# Patient Record
Sex: Female | Born: 1980 | Race: Black or African American | Hispanic: No | Marital: Married | Smoking: Never smoker
Health system: Southern US, Community
[De-identification: ages and names within clinical notes are randomized; demographics above are authoritative.]

## PROBLEM LIST (undated history)

## (undated) DIAGNOSIS — K219 Gastro-esophageal reflux disease without esophagitis: Secondary | ICD-10-CM

---

## 2012-04-06 ENCOUNTER — Emergency Department (HOSPITAL_BASED_OUTPATIENT_CLINIC_OR_DEPARTMENT_OTHER): Payer: Medicaid Other

## 2012-04-06 ENCOUNTER — Emergency Department (HOSPITAL_BASED_OUTPATIENT_CLINIC_OR_DEPARTMENT_OTHER)
Admission: EM | Admit: 2012-04-06 | Discharge: 2012-04-06 | Disposition: A | Discharge status: Home / Self Care | Payer: Medicaid Other | Attending: Emergency Medicine | Admitting: Emergency Medicine

## 2012-04-06 ENCOUNTER — Encounter (HOSPITAL_BASED_OUTPATIENT_CLINIC_OR_DEPARTMENT_OTHER): Payer: Self-pay | Admitting: Family Medicine

## 2012-04-06 DIAGNOSIS — K219 Gastro-esophageal reflux disease without esophagitis: Secondary | ICD-10-CM | POA: Insufficient documentation

## 2012-04-06 DIAGNOSIS — R079 Chest pain, unspecified: Secondary | ICD-10-CM | POA: Insufficient documentation

## 2012-04-06 LAB — CBC WITH DIFFERENTIAL/PLATELET
Basophils Absolute: 0 10*3/uL (ref 0.0–0.1)
Eosinophils Relative: 0 % (ref 0–5)
HCT: 39.4 % (ref 36.0–46.0)
Hemoglobin: 13.9 g/dL (ref 12.0–15.0)
Lymphocytes Relative: 15 % (ref 12–46)
MCHC: 35.3 g/dL (ref 30.0–36.0)
MCV: 84.9 fL (ref 78.0–100.0)
Monocytes Absolute: 0.6 10*3/uL (ref 0.1–1.0)
Monocytes Relative: 7 % (ref 3–12)
RDW: 12.4 % (ref 11.5–15.5)

## 2012-04-06 LAB — URINALYSIS, ROUTINE W REFLEX MICROSCOPIC
Bilirubin Urine: NEGATIVE
Hgb urine dipstick: NEGATIVE
Protein, ur: NEGATIVE mg/dL
Urobilinogen, UA: 1 mg/dL (ref 0.0–1.0)

## 2012-04-06 LAB — BASIC METABOLIC PANEL
BUN: 11 mg/dL (ref 6–23)
Calcium: 9.6 mg/dL (ref 8.4–10.5)
Creatinine, Ser: 0.9 mg/dL (ref 0.50–1.10)
GFR calc non Af Amer: 86 mL/min — ABNORMAL LOW (ref >90)
Glucose, Bld: 109 mg/dL — ABNORMAL HIGH (ref 70–99)

## 2012-04-06 MED ORDER — SODIUM CHLORIDE 0.9 % IV BOLUS (SEPSIS)
1000.0000 mL | Freq: Once | INTRAVENOUS | Status: AC
  Administered 2012-04-06: 1000 mL via INTRAVENOUS

## 2012-04-06 MED ORDER — GI COCKTAIL ~~LOC~~
30.0000 mL | Freq: Once | ORAL | Status: AC
  Administered 2012-04-06: 30 mL via ORAL
  Filled 2012-04-06: qty 30

## 2012-04-06 MED ORDER — FAMOTIDINE 20 MG PO TABS: 20.0000 mg | ORAL_TABLET | Freq: Two times a day (BID) | ORAL | Status: DC

## 2012-04-06 MED ORDER — POTASSIUM CHLORIDE CRYS ER 20 MEQ PO TBCR
40.0000 meq | EXTENDED_RELEASE_TABLET | Freq: Once | ORAL | Status: AC
  Administered 2012-04-06: 40 meq via ORAL
  Filled 2012-04-06: qty 2

## 2012-04-06 NOTE — ED Notes (Signed)
Pt brought via [facility] from work - pt [facility] 129. Pt c/o chest tightness at 2:15pm today that has improved since onset. Pt was hyperventilating upon EMS arrival. Pt smiling and talking in triage, denies shob, n/v. nad noted.

## 2012-04-07 NOTE — ED Provider Notes (Signed)
History     CSN: [id number]  Arrival date & time 04/06/12  1513   First MD Initiated Contact with Patient 04/06/12 1514      Chief Complaint  Patient presents with  . Chest Pain    (Consider location/radiation/quality/duration/timing/severity/associated sxs/prior treatment) HPI Patient is a 31 year old female who presents by EMS today from work for evaluation of chest pain. Patient was sitting at her desk following lunch at 2:15 PM when she developed sudden onset of substernal chest pain which she rates as an 8/10. Patient had some shortness of breath with this. She denied any anxiety preceding the event or heavy exertion. Patient reports that she became slightly lightheaded with this. Upon arrival patient reports 2/10 substernal chest discomfort with no shortness of breath, nausea, vomiting, or lightheadedness. She does not believe she could be pregnant. She does have heavy menstrual periods and is not menstruating currently. She's had no recent fevers or coughs. She denies sick contacts. The timing of patient's symptoms is suspicious for involvement of reflux patient denies this feeling similar to that. She has no history of asthma or other pulmonary diseases. There no other associated or modifying factors. Patient's pain has improved spontaneously that she feels that oxygen on the ambulance may have helped. There is nothing that makes her symptoms worse. History reviewed. No pertinent past medical history.  History reviewed. No pertinent past surgical history.  History reviewed. No pertinent family history.  History  Substance Use Topics  . Smoking status: Never Smoker   . Smokeless tobacco: Not on file  . Alcohol Use: No    OB History    Grav Para Term Preterm Abortions TAB SAB Ect Mult Living                  Review of Systems  Constitutional: Negative.   HENT: Negative.   Eyes: Negative.   Respiratory: Negative.   Cardiovascular: Positive for chest pain.    Gastrointestinal: Negative.   Genitourinary: Negative.   Musculoskeletal: Negative.   Skin: Negative.   Neurological: Negative.   Hematological: Negative.   Psychiatric/Behavioral: Negative.   All other systems reviewed and are negative.    Allergies  Review of patient's allergies indicates no known allergies.  Home Medications   Current Outpatient Rx  Name Route Sig Dispense Refill  . ACETAMINOPHEN 325 MG PO TABS Oral Take 650 mg by mouth every 6 (six) hours as needed. For pain.    Marland Kitchen PRESCRIPTION MEDICATION Oral Take 1 tablet by mouth daily. Generesse SE 0.8-25.    Marland Kitchen FAMOTIDINE 20 MG PO TABS Oral Take 1 tablet (20 mg total) by mouth 2 (two) times daily. 60 tablet 0    BP 115/54  Pulse 71  Temp 98.3 F (36.8 C) (Oral)  Resp 18  SpO2 100%  LMP 03/30/2012  Physical Exam  Nursing note and vitals reviewed. GEN: Well-developed, well-nourished female in no distress HEENT: Atraumatic, normocephalic. Oropharynx clear without erythema EYES: PERRLA BL, no scleral icterus. NECK: Trachea midline, no meningismus CV: regular rate and rhythm. No murmurs, rubs, or gallops PULM: No respiratory distress.  No crackles, wheezes, or rales. GI: soft, non-tender. No guarding, rebound, or tenderness. + bowel sounds  GU: deferred Neuro: cranial nerves grossly 2-12 intact, no abnormalities of strength or sensation, A and O x 3 MSK: Patient moves all 4 extremities symmetrically, no deformity, edema, or injury noted Skin: No rashes petechiae, purpura, or jaundice Psych: no abnormality of mood   ED Course  Procedures (including critical  care time)  Indication: chest pain Please note this EKG was reviewed extemporaneously by myself.   Date: 04/06/2012  Rate: 74  Rhythm: normal sinus rhythm  QRS Axis: normal  Intervals: normal  ST/T Wave abnormalities: normal  Conduction Disutrbances:none  Narrative Interpretation:   Old EKG Reviewed: none available      Labs Reviewed  BASIC  METABOLIC PANEL - Abnormal; Notable for the following:    Potassium 3.4 (*)     Glucose, Bld 109 (*)     GFR calc non Af Amer 86 (*)     All other components within normal limits  URINALYSIS, ROUTINE W REFLEX MICROSCOPIC - Abnormal; Notable for the following:    Ketones, ur 15 (*)     Leukocytes, UA SMALL (*)     All other components within normal limits  CBC WITH DIFFERENTIAL - Abnormal; Notable for the following:    Neutrophils Relative 78 (*)     All other components within normal limits  URINE MICROSCOPIC-ADD ON - Abnormal; Notable for the following:    Squamous Epithelial / LPF FEW (*)     All other components within normal limits  PREGNANCY, URINE   Dg Chest 2 View  04/06/2012  *RADIOLOGY REPORT*  Clinical Data: Chest pain, tightness  CHEST - 2 VIEW  Comparison: None.  Findings: Lungs are clear. No pleural effusion or pneumothorax.  Cardiomediastinal silhouette is within normal limits.  Visualized osseous structures are within normal limits.  IMPRESSION: Normal chest radiographs.   Original Report Authenticated By: Charline Bills, M.D.      1. GERD (gastroesophageal reflux disease)   2. Chest pain       MDM  Based on patient presentation I low suspicion for ACS. Patient had unremarkable EKG and chest x-ray was unremarkable as well. Patient is demonstrating female and hemoglobin was checked. CBC was completely within normal limits. Patient had negative urine pregnancy and was feeling well without any therapies here. Farther along in the visit the patient did feel that she had had an increase in recent episodes of heartburn. She's given a GI cocktail here. Patient does not have a primary care physician but does have insurance and was advised to followup with her PCP. She was given a prescription for Pepcid and was discharged in good condition. Patient was told she is welcome to return if she has a worsening or change her symptoms. She had no risk factors for thromboembolic disease  and was PERC negative.        Cyndra Numbers, MD 04/08/12 [phone]

## 2012-09-02 ENCOUNTER — Encounter (HOSPITAL_BASED_OUTPATIENT_CLINIC_OR_DEPARTMENT_OTHER): Payer: Self-pay

## 2012-09-02 ENCOUNTER — Emergency Department (HOSPITAL_BASED_OUTPATIENT_CLINIC_OR_DEPARTMENT_OTHER)
Admission: EM | Admit: 2012-09-02 | Discharge: 2012-09-02 | Disposition: A | Discharge status: Home / Self Care | Payer: 59 | Attending: Emergency Medicine | Admitting: Emergency Medicine

## 2012-09-02 DIAGNOSIS — Z9889 Other specified postprocedural states: Secondary | ICD-10-CM | POA: Insufficient documentation

## 2012-09-02 DIAGNOSIS — R112 Nausea with vomiting, unspecified: Secondary | ICD-10-CM | POA: Insufficient documentation

## 2012-09-02 DIAGNOSIS — R197 Diarrhea, unspecified: Secondary | ICD-10-CM | POA: Insufficient documentation

## 2012-09-02 DIAGNOSIS — R1110 Vomiting, unspecified: Secondary | ICD-10-CM

## 2012-09-02 LAB — BASIC METABOLIC PANEL
CO2: 24 mEq/L (ref 19–32)
Calcium: 8.9 mg/dL (ref 8.4–10.5)
Chloride: 105 mEq/L (ref 96–112)
Glucose, Bld: 95 mg/dL (ref 70–99)
Sodium: 139 mEq/L (ref 135–145)

## 2012-09-02 MED ORDER — ONDANSETRON 8 MG PO TBDP: 8.0000 mg | ORAL_TABLET | Freq: Three times a day (TID) | ORAL | Status: AC | PRN

## 2012-09-02 MED ORDER — ONDANSETRON HCL 4 MG/2ML IJ SOLN
4.0000 mg | Freq: Once | INTRAMUSCULAR | Status: AC
  Administered 2012-09-02: 4 mg via INTRAVENOUS
  Filled 2012-09-02: qty 2

## 2012-09-02 MED ORDER — SODIUM CHLORIDE 0.9 % IV SOLN
1000.0000 mL | Freq: Once | INTRAVENOUS | Status: AC
  Administered 2012-09-02: 1000 mL via INTRAVENOUS

## 2012-09-02 MED ORDER — SODIUM CHLORIDE 0.9 % IV SOLN
1000.0000 mL | INTRAVENOUS | Status: DC
  Administered 2012-09-02: 1000 mL via INTRAVENOUS

## 2012-09-02 NOTE — ED Provider Notes (Addendum)
History    CSN: [id number] Arrival date & time 09/02/12  0716 First MD Initiated Contact with Patient 09/02/12 [phone]      Chief Complaint  Patient presents with  . Emesis  . Nausea  . Diarrhea    Patient is a 32 y.o. female presenting with vomiting and diarrhea. The history is provided by the patient.  Emesis  This is a new problem. Episode onset: Last night. The problem occurs more than 10 times per day. The problem has not changed since onset.The emesis has an appearance of stomach contents. There has been no fever. Associated symptoms include diarrhea. Pertinent negatives include no abdominal pain and no fever. Risk factors include ill contacts.  Diarrhea The primary symptoms include vomiting and diarrhea. Primary symptoms do not include fever or abdominal pain.  The diarrhea occurs more than 10 times per day.   the patient's husband is here in the emergency room as well for a similar illness.  History reviewed. No pertinent past medical history.  Past Surgical History  Procedure Date  . Cesarean section     No family history on file.  History  Substance Use Topics  . Smoking status: Never Smoker   . Smokeless tobacco: Not on file  . Alcohol Use: No    OB History    Grav Para Term Preterm Abortions TAB SAB Ect Mult Living                  Review of Systems  Constitutional: Negative for fever.  Respiratory: Negative for choking.   Cardiovascular: Negative for chest pain.  Gastrointestinal: Positive for vomiting and diarrhea. Negative for abdominal pain and abdominal distention.  Genitourinary:       Last menstrual period January 10 which was normal    Allergies  Review of patient's allergies indicates no known allergies.  Home Medications   Current Outpatient Rx  Name  Route  Sig  Dispense  Refill  . ACETAMINOPHEN 325 MG PO TABS   Oral   Take 650 mg by mouth every 6 (six) hours as needed. For pain.         Marland Kitchen PRESCRIPTION MEDICATION   Oral   Take 1  tablet by mouth daily. Generesse SE 0.8-25.           BP 114/75  Pulse 104  Temp 97.7 F (36.5 C) (Oral)  Resp 16  Ht 5\' 2"  (1.575 m)  Wt 187 lb (84.823 kg)  BMI 34.20 kg/m2  SpO2 100%  LMP 08/19/2012  Physical Exam  Nursing note and vitals reviewed. Constitutional: She appears well-developed and well-nourished. No distress.  HENT:  Head: Normocephalic and atraumatic.  Right Ear: External ear normal.  Left Ear: External ear normal.  Eyes: Conjunctivae normal are normal. Right eye exhibits no discharge. Left eye exhibits no discharge. No scleral icterus.  Neck: Neck supple. No tracheal deviation present.  Cardiovascular: Normal rate, regular rhythm and intact distal pulses.   Pulmonary/Chest: Effort normal and breath sounds normal. No stridor. No respiratory distress. She has no wheezes. She has no rales.  Abdominal: Soft. Bowel sounds are normal. She exhibits no distension. There is no tenderness. There is no rebound and no guarding.  Musculoskeletal: She exhibits no edema and no tenderness.  Neurological: She is alert. She has normal strength. No sensory deficit. Cranial nerve deficit:  no gross defecits noted. She exhibits normal muscle tone. She displays no seizure activity. Coordination normal.  Skin: Skin is warm and dry. No rash noted.  Psychiatric: She has a normal mood and affect.    ED Course  Procedures (including critical care time)  Labs Reviewed  BASIC METABOLIC PANEL - Abnormal; Notable for the following:    GFR calc non Af Amer 86 (*)     All other components within normal limits   No results found.   1. Vomiting and diarrhea       MDM  Patient most likely has a viral gastroenteritis. Her family member has a similar illness.  Abdominal exam is benign. While in the emergency room, she was given IV fluids and antinausea medications.  Her lab results did not suggest any evidence of severe dehydration. Supportive care was discussed. Reasons to return to  emergency room were also discussed.        Celene Kras, MD 09/02/12 [phone]  Added PE exam.  Forgot to document initially.  Celene Kras, MD 09/14/12 [phone]

## 2012-09-02 NOTE — ED Notes (Signed)
Pt reports onset of nausea, vomiting and diarrhea that started last night.

## 2012-09-30 ENCOUNTER — Emergency Department (HOSPITAL_BASED_OUTPATIENT_CLINIC_OR_DEPARTMENT_OTHER): Payer: 59

## 2012-09-30 ENCOUNTER — Encounter (HOSPITAL_BASED_OUTPATIENT_CLINIC_OR_DEPARTMENT_OTHER): Payer: Self-pay | Admitting: Emergency Medicine

## 2012-09-30 ENCOUNTER — Emergency Department (HOSPITAL_BASED_OUTPATIENT_CLINIC_OR_DEPARTMENT_OTHER)
Admission: EM | Admit: 2012-09-30 | Discharge: 2012-09-30 | Disposition: A | Discharge status: Home / Self Care | Payer: 59 | Attending: Emergency Medicine | Admitting: Emergency Medicine

## 2012-09-30 DIAGNOSIS — R1110 Vomiting, unspecified: Secondary | ICD-10-CM | POA: Insufficient documentation

## 2012-09-30 DIAGNOSIS — K219 Gastro-esophageal reflux disease without esophagitis: Secondary | ICD-10-CM | POA: Insufficient documentation

## 2012-09-30 DIAGNOSIS — R1013 Epigastric pain: Secondary | ICD-10-CM | POA: Insufficient documentation

## 2012-09-30 DIAGNOSIS — Z3202 Encounter for pregnancy test, result negative: Secondary | ICD-10-CM | POA: Insufficient documentation

## 2012-09-30 LAB — D-DIMER, QUANTITATIVE: D-Dimer, Quant: <0.27 ug/mL-FEU (ref 0.00–0.48)

## 2012-09-30 LAB — PREGNANCY, URINE: Preg Test, Ur: NEGATIVE

## 2012-09-30 MED ORDER — GI COCKTAIL ~~LOC~~
30.0000 mL | Freq: Once | ORAL | Status: AC
  Administered 2012-09-30: 30 mL via ORAL

## 2012-09-30 MED ORDER — OMEPRAZOLE 20 MG PO CPDR: 20.0000 mg | DELAYED_RELEASE_CAPSULE | Freq: Every day | ORAL | Status: DC

## 2012-09-30 MED ORDER — ONDANSETRON 8 MG PO TBDP
8.0000 mg | ORAL_TABLET | Freq: Once | ORAL | Status: AC
  Administered 2012-09-30: 8 mg via ORAL

## 2012-09-30 MED ORDER — GI COCKTAIL ~~LOC~~
ORAL | Status: AC
  Administered 2012-09-30: 30 mL via ORAL
  Filled 2012-09-30: qty 30

## 2012-09-30 MED ORDER — ONDANSETRON 8 MG PO TBDP
ORAL_TABLET | ORAL | Status: AC
  Administered 2012-09-30: 8 mg via ORAL
  Filled 2012-09-30: qty 1

## 2012-09-30 NOTE — ED Notes (Signed)
MD at bedside. 

## 2012-09-30 NOTE — ED Notes (Signed)
MD at bedside giving test results and discharge plan of care.

## 2012-09-30 NOTE — ED Provider Notes (Signed)
History     CSN: [id number]  Arrival date & time 09/30/12  0112   First MD Initiated Contact with Patient 09/30/12 0127      Chief Complaint  Patient presents with  . Chest Pain  . Emesis    (Consider location/radiation/quality/duration/timing/severity/associated sxs/prior treatment) Patient is a 32 y.o. female presenting with chest pain and vomiting. The history is provided by the patient.  Chest Pain Pain location:  Epigastric and substernal area Pain quality: sharp   Pain radiates to the back: no   Pain severity:  Severe Onset quality:  Unable to specify Timing:  Constant Progression:  Unchanged Chronicity:  Recurrent Context: eating   Context comment:  Ate at [facility] before bed,  Similar episode in August Relieved by:  Nothing Worsened by:  Nothing tried Ineffective treatments:  None tried Associated symptoms: vomiting   Associated symptoms: no abdominal pain, no diaphoresis and no palpitations   Vomiting:    Quality:  Stomach contents   Number of occurrences:  1   Severity:  Mild Risk factors: no prior DVT/PE   Emesis Severity:  Mild Timing:  Constant Number of daily episodes:  1 Quality:  Stomach contents Progression:  Resolved Chronicity:  Recurrent Associated symptoms: no abdominal pain     History reviewed. No pertinent past medical history.  Past Surgical History  Procedure Laterality Date  . Cesarean section      No family history on file.  History  Substance Use Topics  . Smoking status: Never Smoker   . Smokeless tobacco: Not on file  . Alcohol Use: No    OB History   Grav Para Term Preterm Abortions TAB SAB Ect Mult Living                  Review of Systems  Constitutional: Negative for diaphoresis.  Cardiovascular: Positive for chest pain. Negative for palpitations and leg swelling.  Gastrointestinal: Positive for vomiting. Negative for abdominal pain.  All other systems reviewed and are negative.    Allergies  Review  of patient's allergies indicates no known allergies.  Home Medications   Current Outpatient Rx  Name  Route  Sig  Dispense  Refill  . acetaminophen (TYLENOL) 325 MG tablet   Oral   Take 650 mg by mouth every 6 (six) hours as needed. For pain.         Marland Kitchen ondansetron (ZOFRAN ODT) 8 MG disintegrating tablet   Oral   Take 1 tablet (8 mg total) by mouth every 8 (eight) hours as needed for nausea.   20 tablet   0   . PRESCRIPTION MEDICATION   Oral   Take 1 tablet by mouth daily. Generesse SE 0.8-25.           BP 132/84  Pulse 94  Temp(Src) 98.2 F (36.8 C) (Oral)  Resp 18  Ht 5\' 2"  (1.575 m)  Wt 184 lb (83.462 kg)  BMI 33.65 kg/m2  SpO2 100%  LMP 09/19/2012  Physical Exam  Constitutional: She is oriented to person, place, and time. She appears well-developed and well-nourished. No distress.  HENT:  Head: Normocephalic and atraumatic.  Mouth/Throat: Oropharynx is clear and moist.  Eyes: Conjunctivae are normal. Pupils are equal, round, and reactive to light.  Neck: Normal range of motion. Neck supple.  Cardiovascular: Normal rate, regular rhythm and intact distal pulses.   Pulmonary/Chest: Effort normal and breath sounds normal.  Abdominal: Soft. Bowel sounds are normal. She exhibits no distension. There is no tenderness.  There is no rebound.  Musculoskeletal: Normal range of motion.  Neurological: She is alert and oriented to person, place, and time.  Skin: Skin is warm and dry.  Psychiatric: Her mood appears anxious.    ED Course  Procedures (including critical care time)  Labs Reviewed  PREGNANCY, URINE  D-DIMER, QUANTITATIVE   No results found.   No diagnosis found.    MDM  Very low suspicion for ACS.  Normal EKG.  Symptoms are same as episode in August.  Had bitter acidic taste before to CP and vomiting consistent with GERD.  Will treat with zofran aned GI cocktail.  Symptoms resolved with GI cocktail.      Date: 09/30/2012  Rate: 77  Rhythm:  normal sinus rhythm  QRS Axis: normal  Intervals: normal  ST/T Wave abnormalities: normal  Conduction Disutrbances: none  Narrative Interpretation: unremarkable        Brinlynn Gorton K Skye Rodarte-Rasch, MD 09/30/12 0300

## 2012-09-30 NOTE — ED Notes (Signed)
Bil sharp upper CP that started at 1230 tonight.  Also epigastric pain.  Diaphoresis, SOB, vomited x1.  Inc pain with deep breath and movement.  Had similar episode 1 yr ago and was seen here for it.  1 episode 2 days ago that resolved on its own.

## 2013-02-26 ENCOUNTER — Encounter (HOSPITAL_BASED_OUTPATIENT_CLINIC_OR_DEPARTMENT_OTHER): Payer: Self-pay | Admitting: Emergency Medicine

## 2013-02-26 ENCOUNTER — Emergency Department (HOSPITAL_BASED_OUTPATIENT_CLINIC_OR_DEPARTMENT_OTHER): Payer: 59

## 2013-02-26 ENCOUNTER — Emergency Department (HOSPITAL_BASED_OUTPATIENT_CLINIC_OR_DEPARTMENT_OTHER)
Admission: EM | Admit: 2013-02-26 | Discharge: 2013-02-26 | Disposition: A | Discharge status: Home / Self Care | Payer: 59 | Attending: Emergency Medicine | Admitting: Emergency Medicine

## 2013-02-26 DIAGNOSIS — Z3202 Encounter for pregnancy test, result negative: Secondary | ICD-10-CM | POA: Insufficient documentation

## 2013-02-26 DIAGNOSIS — Z79899 Other long term (current) drug therapy: Secondary | ICD-10-CM | POA: Insufficient documentation

## 2013-02-26 DIAGNOSIS — K219 Gastro-esophageal reflux disease without esophagitis: Secondary | ICD-10-CM | POA: Insufficient documentation

## 2013-02-26 DIAGNOSIS — R1110 Vomiting, unspecified: Secondary | ICD-10-CM | POA: Insufficient documentation

## 2013-02-26 HISTORY — DX: Gastro-esophageal reflux disease without esophagitis: K21.9

## 2013-02-26 LAB — CBC WITH DIFFERENTIAL/PLATELET
Hemoglobin: 13.8 g/dL (ref 12.0–15.0)
Lymphocytes Relative: 28 % (ref 12–46)
Lymphs Abs: 2.1 10*3/uL (ref 0.7–4.0)
MCH: 30.2 pg (ref 26.0–34.0)
Monocytes Relative: 7 % (ref 3–12)
Neutro Abs: 4.8 10*3/uL (ref 1.7–7.7)
Neutrophils Relative %: 64 % (ref 43–77)
Platelets: 282 10*3/uL (ref 150–400)
RBC: 4.57 MIL/uL (ref 3.87–5.11)
WBC: 7.5 10*3/uL (ref 4.0–10.5)

## 2013-02-26 LAB — COMPREHENSIVE METABOLIC PANEL
ALT: 10 U/L (ref 0–35)
Alkaline Phosphatase: 75 U/L (ref 39–117)
BUN: 15 mg/dL (ref 6–23)
CO2: 24 mEq/L (ref 19–32)
Chloride: 102 mEq/L (ref 96–112)
GFR calc Af Amer: >90 mL/min (ref >90)
Glucose, Bld: 173 mg/dL — ABNORMAL HIGH (ref 70–99)
Potassium: 3.7 mEq/L (ref 3.5–5.1)
Sodium: 138 mEq/L (ref 135–145)
Total Bilirubin: 0.3 mg/dL (ref 0.3–1.2)
Total Protein: 7.8 g/dL (ref 6.0–8.3)

## 2013-02-26 LAB — PREGNANCY, URINE: Preg Test, Ur: NEGATIVE

## 2013-02-26 LAB — LIPASE, BLOOD: Lipase: 36 U/L (ref 11–59)

## 2013-02-26 MED ORDER — SUCRALFATE 1 GM/10ML PO SUSP: 1.0000 g | Freq: Four times a day (QID) | ORAL | Status: DC

## 2013-02-26 MED ORDER — ONDANSETRON HCL 8 MG PO TABS
ORAL_TABLET | ORAL | Status: AC
  Administered 2013-02-26: 8 mg
  Filled 2013-02-26: qty 1

## 2013-02-26 MED ORDER — ONDANSETRON 8 MG PO TBDP: ORAL_TABLET | ORAL | Status: AC

## 2013-02-26 MED ORDER — OMEPRAZOLE 20 MG PO CPDR: 20.0000 mg | DELAYED_RELEASE_CAPSULE | Freq: Every day | ORAL | Status: DC

## 2013-02-26 MED ORDER — ONDANSETRON 8 MG PO TBDP
8.0000 mg | ORAL_TABLET | Freq: Once | ORAL | Status: AC
  Administered 2013-02-26: 8 mg via ORAL
  Filled 2013-02-26: qty 1

## 2013-02-26 MED ORDER — GI COCKTAIL ~~LOC~~
30.0000 mL | Freq: Once | ORAL | Status: AC
  Administered 2013-02-26: 30 mL via ORAL
  Filled 2013-02-26: qty 30

## 2013-02-26 NOTE — ED Provider Notes (Signed)
History    CSN: [id number] Arrival date & time 02/26/13  0440  First MD Initiated Contact with Patient 02/26/13 [phone]     Chief Complaint  Patient presents with  . Abdominal Pain    epigastric area   (Consider location/radiation/quality/duration/timing/severity/associated sxs/prior Treatment) Patient is a 32 y.o. female presenting with abdominal pain. The history is provided by the patient.  Abdominal Pain This is a recurrent problem. The current episode started 3 to 5 hours ago. The problem occurs constantly. The problem has not changed since onset.Associated symptoms include abdominal pain. Pertinent negatives include no chest pain, no headaches and no shortness of breath. Nothing aggravates the symptoms. Nothing relieves the symptoms. She has tried nothing for the symptoms. The treatment provided no relief.  Patient with a h/o GERD and has not been taking her PPI on a scheduled basis, ate at [employer] this evening; a hamburger and fries and then had burning pain in the epigastrum and vomited x 4.  Has also had increased gas Past Medical History  Diagnosis Date  . GERD (gastroesophageal reflux disease)    Past Surgical History  Procedure Laterality Date  . Cesarean section     History reviewed. No pertinent family history. History  Substance Use Topics  . Smoking status: Never Smoker   . Smokeless tobacco: Not on file  . Alcohol Use: No   OB History   Grav Para Term Preterm Abortions TAB SAB Ect Mult Living                 Review of Systems  Constitutional: Negative for fever and diaphoresis.  Respiratory: Negative for chest tightness and shortness of breath.   Cardiovascular: Negative for chest pain, palpitations and leg swelling.  Gastrointestinal: Positive for vomiting and abdominal pain.  Neurological: Negative for headaches.  All other systems reviewed and are negative.    Allergies  Review of patient's allergies indicates no known allergies.  Home  Medications   Current Outpatient Rx  Name  Route  Sig  Dispense  Refill  . acetaminophen (TYLENOL) 325 MG tablet   Oral   Take 650 mg by mouth every 6 (six) hours as needed. For pain.         Marland Kitchen omeprazole (PRILOSEC) 20 MG capsule   Oral   Take 1 capsule (20 mg total) by mouth daily.   30 capsule   0   . ondansetron (ZOFRAN ODT) 8 MG disintegrating tablet   Oral   Take 1 tablet (8 mg total) by mouth every 8 (eight) hours as needed for nausea.   20 tablet   0   . PRESCRIPTION MEDICATION   Oral   Take 1 tablet by mouth daily. Generesse SE 0.8-25.          BP 131/84  Pulse 77  Temp(Src) 98 F (36.7 C)  Resp 20  SpO2 100%  LMP 01/27/2013 Physical Exam  Constitutional: She is oriented to person, place, and time. She appears well-developed and well-nourished. No distress.  HENT:  Head: Normocephalic and atraumatic.  Mouth/Throat: Oropharynx is clear and moist.  Eyes: Conjunctivae are normal. Pupils are equal, round, and reactive to light.  Neck: Normal range of motion. Neck supple.  Cardiovascular: Normal rate, regular rhythm and intact distal pulses.   Pulmonary/Chest: Effort normal and breath sounds normal. She has no wheezes. She has no rales.  Abdominal: Soft. She exhibits no distension. Bowel sounds are increased. There is no rebound and no guarding.    Musculoskeletal:  Normal range of motion.  Neurological: She is alert and oriented to person, place, and time.  Skin: Skin is warm and dry.  Psychiatric: She has a normal mood and affect.    ED Course  Procedures (including critical care time) Labs Reviewed  PREGNANCY, URINE  CBC WITH DIFFERENTIAL  COMPREHENSIVE METABOLIC PANEL  LIPASE, BLOOD   No results found. No diagnosis found.  MDM   Date: 02/26/2013  Rate: 82  Rhythm: normal sinus rhythm  QRS Axis: normal  Intervals: normal  ST/T Wave abnormalities: normal  Conduction Disutrbances: none  Narrative Interpretation: unremarkable   Negative  EKG and pain that is consistent with GERD and is restricted to the abdominal cavity and resolved with GI cocktail.  This is not cardiac in nature.  Patient must stay on PPI and follow up with her doctor for ongoing care of her GERD and adhere to a GERD friendly diet.     Symptoms improved post GI cocktail  Azya Barbero K Nalu Troublefield-Rasch, MD 02/26/13 [phone]

## 2013-02-26 NOTE — ED Notes (Signed)
Pt c/o abd pain epigastric area on set after eating fast food  Then emesis x4  On set at 15:00 today history GERD but has not be taking meds routinely

## 2013-11-24 IMAGING — CR DG ABDOMEN ACUTE W/ 1V CHEST
4 series · 4 of 4 positions shown · non-contrast
Comparison: Chest radiograph performed 09/30/2012

CLINICAL DATA: Epigastric abdominal pain.  Vomiting.

ACUTE ABDOMEN SERIES (ABDOMEN 2 VIEW & CHEST 1 VIEW)

[w chest pa]
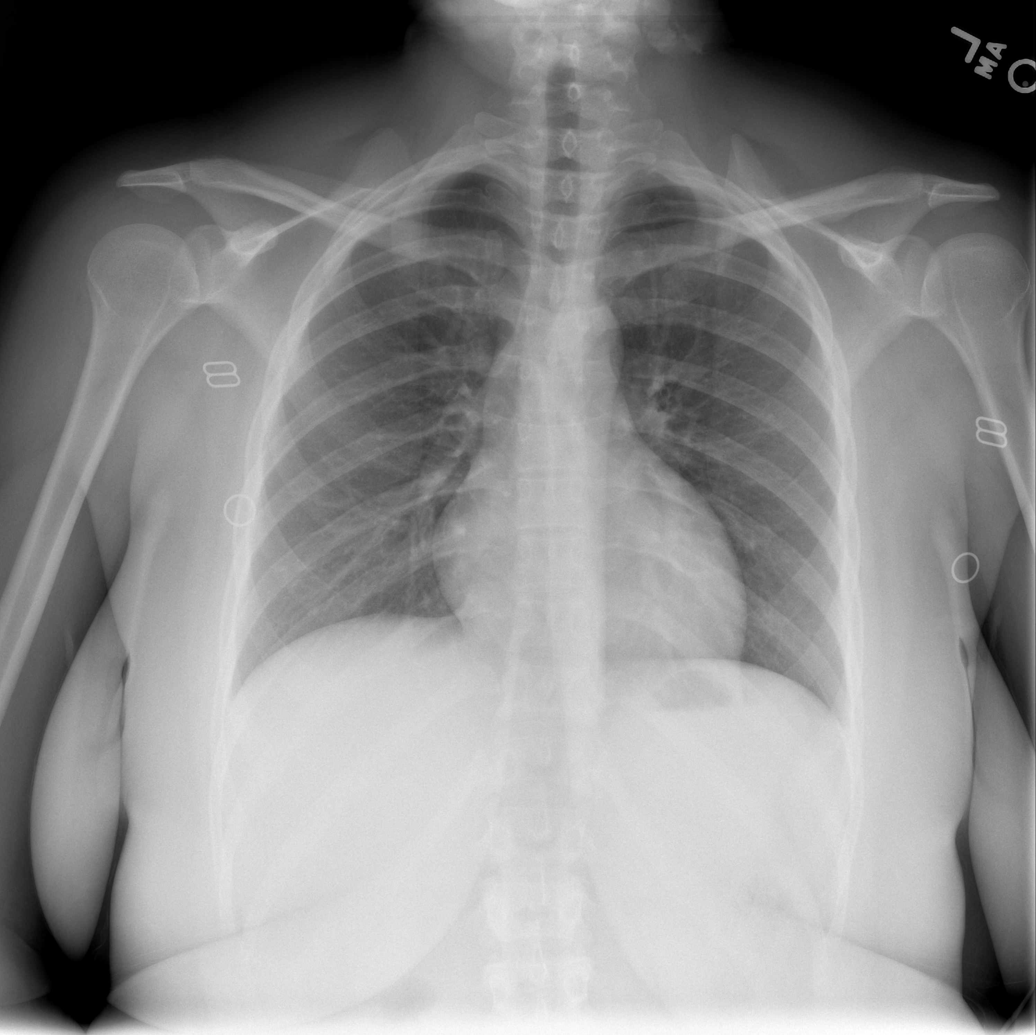

[w abdomen upright]
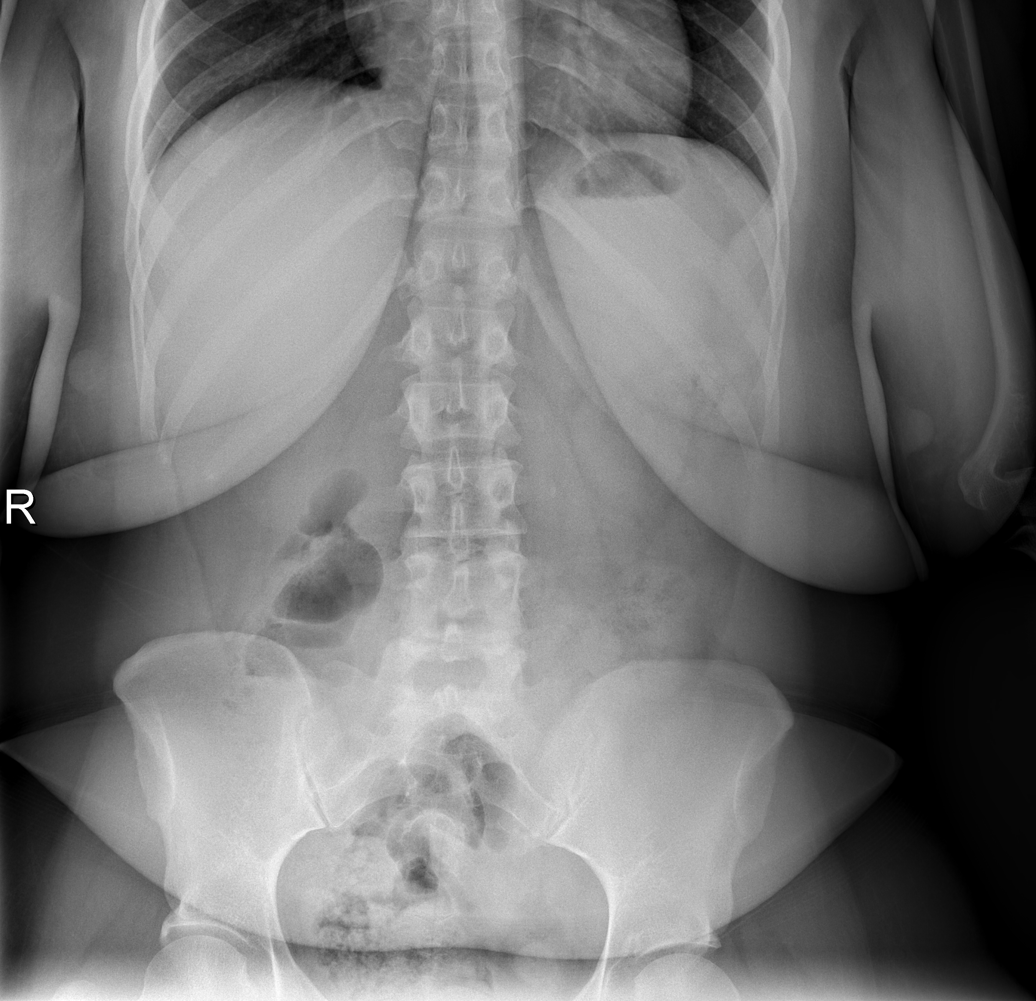

[t abdomen supine (1 of 2)]
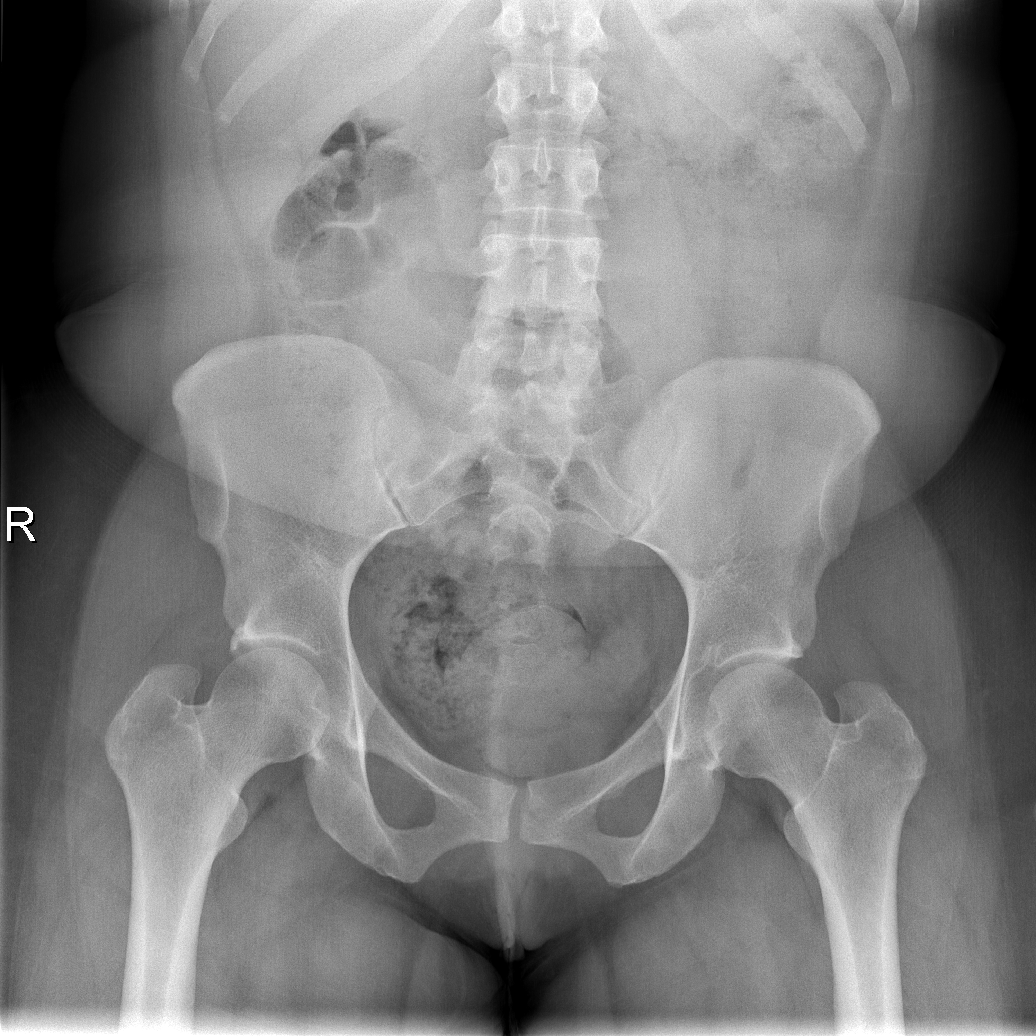

[t abdomen supine (2 of 2)]
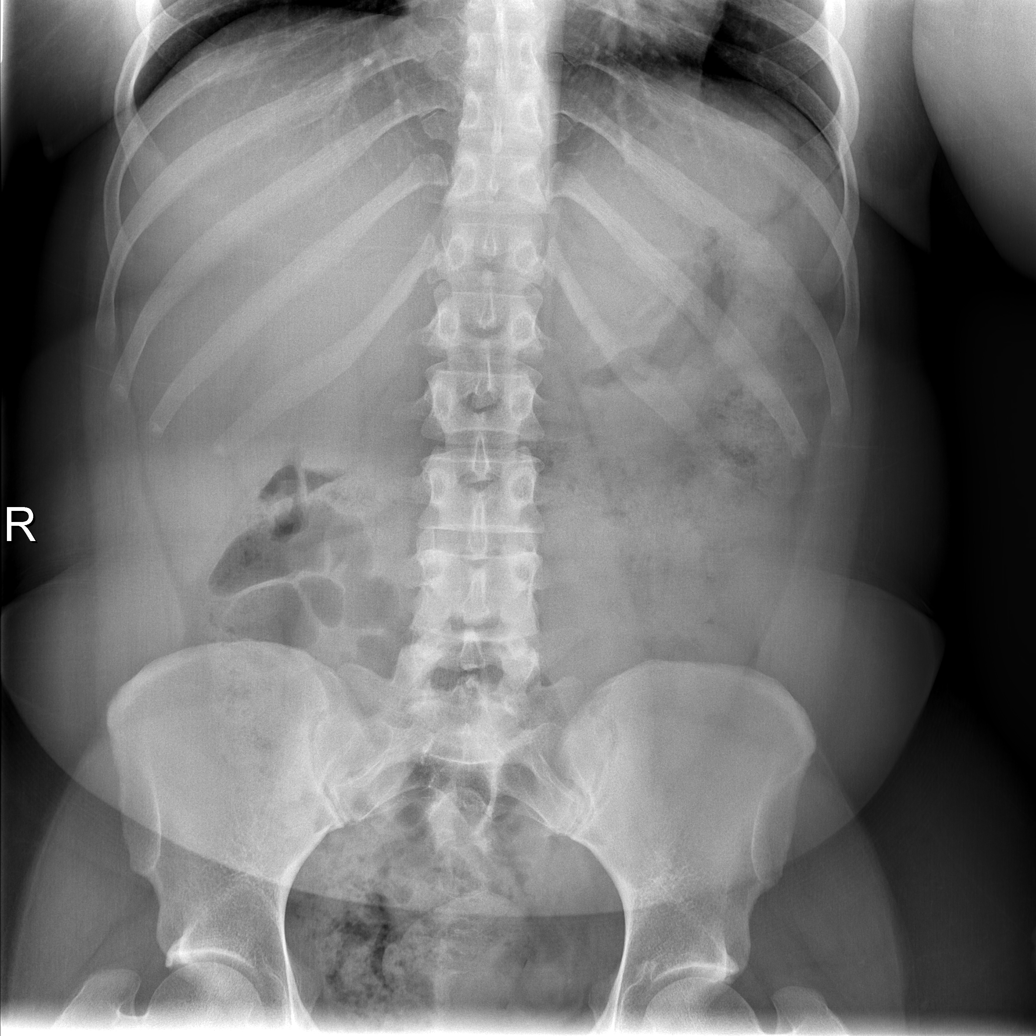

[4 of 4 positions shown; findings below may reference images not displayed]

FINDINGS: The lungs are well-aerated and clear.  There is no
evidence of focal opacification, pleural effusion or pneumothorax.
The cardiomediastinal silhouette is within normal limits.

The visualized bowel gas pattern is unremarkable.  Scattered stool
and air are seen within the colon; there is no evidence of small
bowel dilatation to suggest obstruction.  No free intra-abdominal
air is identified on the provided upright view.

No acute osseous abnormalities are seen; the sacroiliac joints are
unremarkable in appearance.
IMPRESSION: 1.  Unremarkable bowel gas pattern; no free intra-abdominal air
seen.
2.  No acute cardiopulmonary process identified.

## 2015-10-26 ENCOUNTER — Encounter (HOSPITAL_BASED_OUTPATIENT_CLINIC_OR_DEPARTMENT_OTHER): Payer: Self-pay | Admitting: Emergency Medicine

## 2015-10-26 ENCOUNTER — Emergency Department (HOSPITAL_BASED_OUTPATIENT_CLINIC_OR_DEPARTMENT_OTHER)
Admission: EM | Admit: 2015-10-26 | Discharge: 2015-10-26 | Disposition: A | Discharge status: Home / Self Care | Payer: Medicaid Other | Attending: Emergency Medicine | Admitting: Emergency Medicine

## 2015-10-26 DIAGNOSIS — Z79899 Other long term (current) drug therapy: Secondary | ICD-10-CM | POA: Insufficient documentation

## 2015-10-26 DIAGNOSIS — R0602 Shortness of breath: Secondary | ICD-10-CM | POA: Insufficient documentation

## 2015-10-26 DIAGNOSIS — R079 Chest pain, unspecified: Secondary | ICD-10-CM

## 2015-10-26 DIAGNOSIS — K2100 Gastro-esophageal reflux disease with esophagitis, without bleeding: Secondary | ICD-10-CM

## 2015-10-26 MED ORDER — SUCRALFATE 1 GM/10ML PO SUSP: 1.0000 g | Freq: Four times a day (QID) | ORAL | Status: AC

## 2015-10-26 MED ORDER — OMEPRAZOLE 20 MG PO CPDR: 20.0000 mg | DELAYED_RELEASE_CAPSULE | Freq: Every day | ORAL | Status: AC

## 2015-10-26 NOTE — ED Provider Notes (Signed)
CSN: [id number]     Arrival date & time 10/26/15  2013 History  By signing my name below, I, [facility], attest that this documentation has been prepared under the direction and in the presence of Marily Memos, MD. Electronically Signed: Randell Patient, ED Scribe. 10/26/2015. 12:24 AM.   Chief Complaint  Patient presents with  . Chest Pain  . Heartburn    The history is provided by the patient. No language interpreter was used.   HPI Comments: Christine Chan is a 35 y.o. female with an hx of GERD who presents to the Emergency Department complaining of constant, gradually improving, mild epigastric pain onset earlier today. Patient reports burning pain and pressure in the her upper abdomen and chest 30 minutes after eating. She endorses intermittent nausea and SOB.  She has taken Tums without relief. She notes similar symptoms in the past that was associated with GERD. Denies hx of cholecystectomy, DVT, HTN, HLN, kidney problems and states that she is otherwise healthy. Denies back pain, rashes different from baseline due to eczema, dysuria, frequency. LMP 2 months ago  Past Medical History  Diagnosis Date  . GERD (gastroesophageal reflux disease)    Past Surgical History  Procedure Laterality Date  . Cesarean section     History reviewed. No pertinent family history. Social History  Substance Use Topics  . Smoking status: Never Smoker   . Smokeless tobacco: None  . Alcohol Use: No   OB History    No data available     Review of Systems  Respiratory: Positive for shortness of breath.   Gastrointestinal: Positive for nausea and abdominal pain.  Genitourinary: Negative for dysuria and frequency.  Musculoskeletal: Negative for back pain.  Skin: Negative for rash.      Allergies  Review of patient's allergies indicates no known allergies.  Home Medications   Prior to Admission medications   Medication Sig Start Date End Date Taking? Authorizing Provider   acetaminophen (TYLENOL) 325 MG tablet Take 650 mg by mouth every 6 (six) hours as needed. For pain.    Historical Provider, MD  omeprazole (PRILOSEC) 20 MG capsule Take 1 capsule (20 mg total) by mouth daily. 10/26/15   Marily Memos, MD  ondansetron (ZOFRAN ODT) 8 MG disintegrating tablet Take 1 tablet (8 mg total) by mouth every 8 (eight) hours as needed for nausea. 09/02/12   Linwood Dibbles, MD  ondansetron (ZOFRAN ODT) 8 MG disintegrating tablet  ODT q8 hours prn nausea 02/26/13   April Palumbo, MD  PRESCRIPTION MEDICATION Take 1 tablet by mouth daily. Generesse SE 0.8-25.    Historical Provider, MD  sucralfate (CARAFATE) 1 GM/10ML suspension Take 10 mLs (1 g total) by mouth 4 (four) times daily. 10/26/15   Marily Memos, MD   BP 108/84 mmHg  Pulse 63  Temp(Src) 97.7 F (36.5 C) (Oral)  Resp 18  Ht  (1.575 m)  Wt 182 lb (82.555 kg)  BMI 33.28 kg/m2  SpO2 100% Physical Exam  Constitutional: She is oriented to person, place, and time. She appears well-developed and well-nourished. No distress.  HENT:  Head: Normocephalic and atraumatic.  Eyes: Conjunctivae and EOM are normal.  Neck: Neck supple. No tracheal deviation present.  Cardiovascular: Normal rate, regular rhythm and normal heart sounds.  Exam reveals no gallop and no friction rub.   No murmur heard. Heart normal. No BLE edema.  Pulmonary/Chest: Effort normal. No respiratory distress.  Lungs CTA.  Abdominal: Soft. Bowel sounds are normal. She exhibits no distension  and no mass. There is no tenderness. There is no rebound and no guarding.  Abdomen benign.  Musculoskeletal: Normal range of motion.  Neurological: She is alert and oriented to person, place, and time.  Skin: Skin is warm and dry. No rash noted.  No rashes.  Psychiatric: She has a normal mood and affect. Her behavior is normal.  Nursing note and vitals reviewed.   ED Course  Procedures   DIAGNOSTIC STUDIES: Oxygen Saturation is 98% on RA, normal by my  interpretation.    COORDINATION OF CARE: 8:42 PM Advised pt to take OTC medication. Will prescribe medication. Discussed treatment plan with pt at bedside and pt agreed to plan.   Labs Review Labs Reviewed - No data to display  Imaging Review No results found. I have personally reviewed and evaluated these images and lab results as part of my medical decision-making.   EKG Interpretation None      MDM   Final diagnoses:  Chest pain, unspecified chest pain type  Reflux esophagitis    Likely reflux. Feels the same as previous reflux. Associated nausea and sob, none now. No exertional component. No RF for ACS or PE. Will tx as esophagitis and FU w/ PCP.  I have personally and contemperaneously reviewed labs and imaging and used in my decision making as above.   A medical screening exam was performed and I feel the patient has had an appropriate workup for their chief complaint at this time and likelihood of emergent condition existing is low. Their vital signs are stable. They have been counseled on decision, discharge, follow up and which symptoms necessitate immediate return to the emergency department.  They verbally stated understanding and agreement with plan and discharged in stable condition.   I personally performed the services described in this documentation, which was scribed in my presence. The recorded information has been reviewed and is accurate.    Marily MemosJason Shineka Auble, MD 10/27/15 Moses [email or web address]

## 2015-10-26 NOTE — ED Notes (Signed)
Pt in c/o epigastric burning and tightness onset today, has hx of reflux and this feels the same. Pt in NAD.

## 2015-10-26 NOTE — ED Notes (Signed)
Dr. Mesner into room.
